# Patient Record
Sex: Female | Born: 1965
Health system: Southern US, Community
[De-identification: ages and names within clinical notes are randomized; demographics above are authoritative.]

## PROBLEM LIST (undated history)

## (undated) DIAGNOSIS — K219 Gastro-esophageal reflux disease without esophagitis: Secondary | ICD-10-CM

## (undated) DIAGNOSIS — I10 Essential (primary) hypertension: Secondary | ICD-10-CM

## (undated) DIAGNOSIS — K5900 Constipation, unspecified: Secondary | ICD-10-CM

## (undated) DIAGNOSIS — M109 Gout, unspecified: Secondary | ICD-10-CM

## (undated) DIAGNOSIS — M1990 Unspecified osteoarthritis, unspecified site: Secondary | ICD-10-CM

## (undated) DIAGNOSIS — T7840XA Allergy, unspecified, initial encounter: Secondary | ICD-10-CM

## (undated) HISTORY — DX: Essential (primary) hypertension: I10

## (undated) HISTORY — DX: Gastro-esophageal reflux disease without esophagitis: K21.9

## (undated) HISTORY — DX: Allergy, unspecified, initial encounter: T78.40XA

## (undated) HISTORY — DX: Unspecified osteoarthritis, unspecified site: M19.90

## (undated) HISTORY — DX: Gout, unspecified: M10.9

## (undated) HISTORY — DX: Constipation, unspecified: K59.00

## (undated) HISTORY — PX: NO PAST SURGERIES: SHX2092

---

## 1999-08-01 ENCOUNTER — Other Ambulatory Visit: Admission: RE | Admit: 1999-08-01 | Discharge: 1999-08-01 | Payer: Self-pay | Admitting: *Deleted

## 1999-09-28 ENCOUNTER — Encounter: Admission: RE | Admit: 1999-09-28 | Discharge: 1999-09-28 | Payer: Self-pay | Admitting: Gastroenterology

## 1999-09-28 ENCOUNTER — Encounter: Payer: Self-pay | Admitting: Gastroenterology

## 1999-11-05 ENCOUNTER — Other Ambulatory Visit: Admission: RE | Admit: 1999-11-05 | Discharge: 1999-11-05 | Payer: Self-pay | Admitting: Gastroenterology

## 2000-08-29 ENCOUNTER — Other Ambulatory Visit: Admission: RE | Admit: 2000-08-29 | Discharge: 2000-08-29 | Payer: Self-pay | Admitting: *Deleted

## 2000-09-08 ENCOUNTER — Other Ambulatory Visit: Admission: RE | Admit: 2000-09-08 | Discharge: 2000-09-08 | Payer: Self-pay | Admitting: Urology

## 2000-09-16 ENCOUNTER — Encounter: Payer: Self-pay | Admitting: Urology

## 2000-09-16 ENCOUNTER — Encounter: Admission: RE | Admit: 2000-09-16 | Discharge: 2000-09-16 | Payer: Self-pay | Admitting: Urology

## 2001-04-08 ENCOUNTER — Other Ambulatory Visit: Admission: RE | Admit: 2001-04-08 | Discharge: 2001-04-08 | Payer: Self-pay | Admitting: Urology

## 2001-08-04 ENCOUNTER — Other Ambulatory Visit: Admission: RE | Admit: 2001-08-04 | Discharge: 2001-08-04 | Payer: Self-pay | Admitting: Internal Medicine

## 2003-02-04 ENCOUNTER — Emergency Department (HOSPITAL_COMMUNITY): Admission: EM | Admit: 2003-02-04 | Discharge: 2003-02-04 | Payer: Self-pay | Admitting: Emergency Medicine

## 2004-06-05 ENCOUNTER — Other Ambulatory Visit: Admission: RE | Admit: 2004-06-05 | Discharge: 2004-06-05 | Payer: Self-pay | Admitting: Internal Medicine

## 2004-10-08 ENCOUNTER — Emergency Department (HOSPITAL_COMMUNITY): Admission: EM | Admit: 2004-10-08 | Discharge: 2004-10-08 | Payer: Self-pay | Admitting: Emergency Medicine

## 2006-07-21 ENCOUNTER — Encounter: Payer: Self-pay | Admitting: Cardiology

## 2008-03-08 ENCOUNTER — Emergency Department (HOSPITAL_COMMUNITY): Admission: EM | Admit: 2008-03-08 | Discharge: 2008-03-08 | Payer: Self-pay | Admitting: Family Medicine

## 2009-10-11 ENCOUNTER — Encounter: Payer: Self-pay | Admitting: Cardiology

## 2009-10-12 ENCOUNTER — Encounter: Payer: Self-pay | Admitting: Cardiology

## 2009-10-19 ENCOUNTER — Ambulatory Visit: Payer: Self-pay | Admitting: Cardiology

## 2009-10-19 DIAGNOSIS — I10 Essential (primary) hypertension: Secondary | ICD-10-CM | POA: Insufficient documentation

## 2009-11-02 ENCOUNTER — Ambulatory Visit (HOSPITAL_COMMUNITY): Admission: RE | Admit: 2009-11-02 | Discharge: 2009-11-02 | Payer: Self-pay | Admitting: Cardiology

## 2009-11-02 ENCOUNTER — Ambulatory Visit: Payer: Self-pay | Admitting: Cardiology

## 2009-11-02 ENCOUNTER — Ambulatory Visit: Payer: Self-pay

## 2009-11-02 ENCOUNTER — Encounter: Payer: Self-pay | Admitting: Cardiology

## 2009-11-07 ENCOUNTER — Telehealth: Payer: Self-pay | Admitting: Cardiology

## 2010-02-26 IMAGING — CR DG HIP COMPLETE 2+V*R*
3 series · 3 of 3 positions shown · non-contrast
Comparison: None

CLINICAL DATA: Right hip pain radiating down right leg, no known
injury

RIGHT HIP - COMPLETE 2+ VIEW

[view not recorded (1 of 3)]
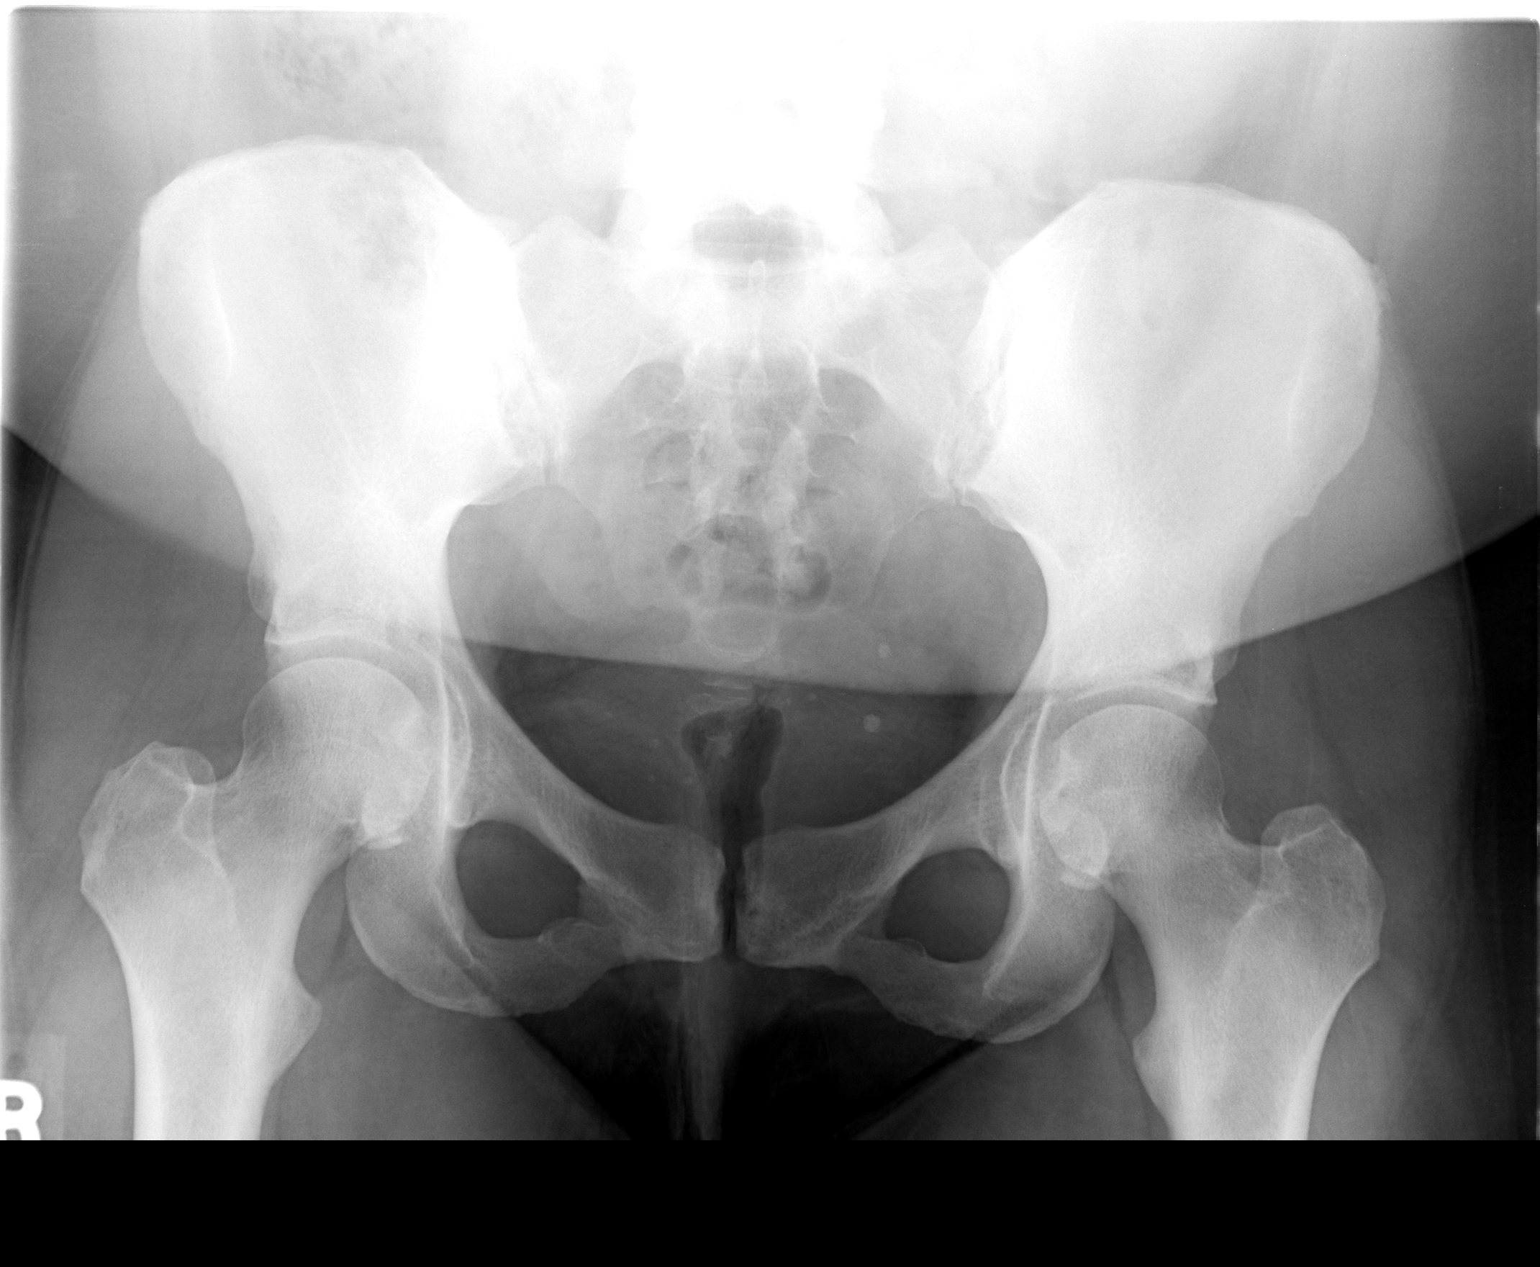

[view not recorded (2 of 3)]
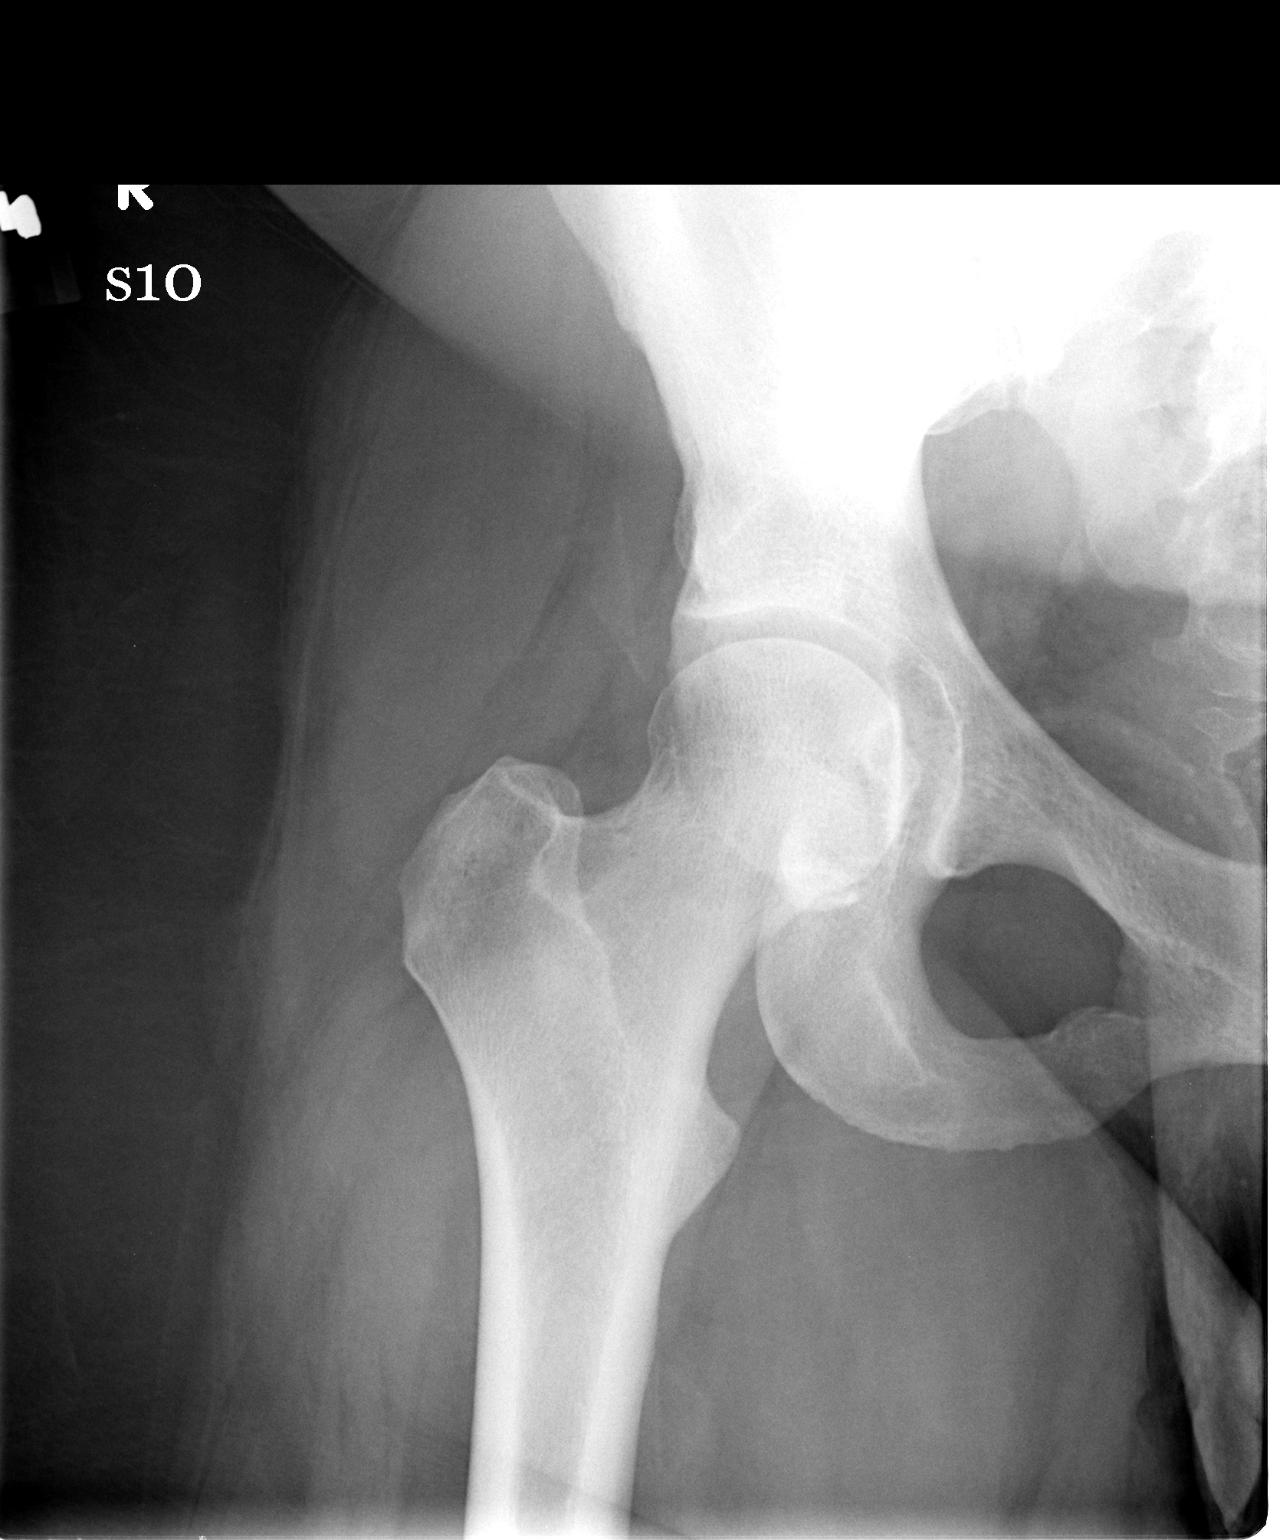

[view not recorded (3 of 3)]
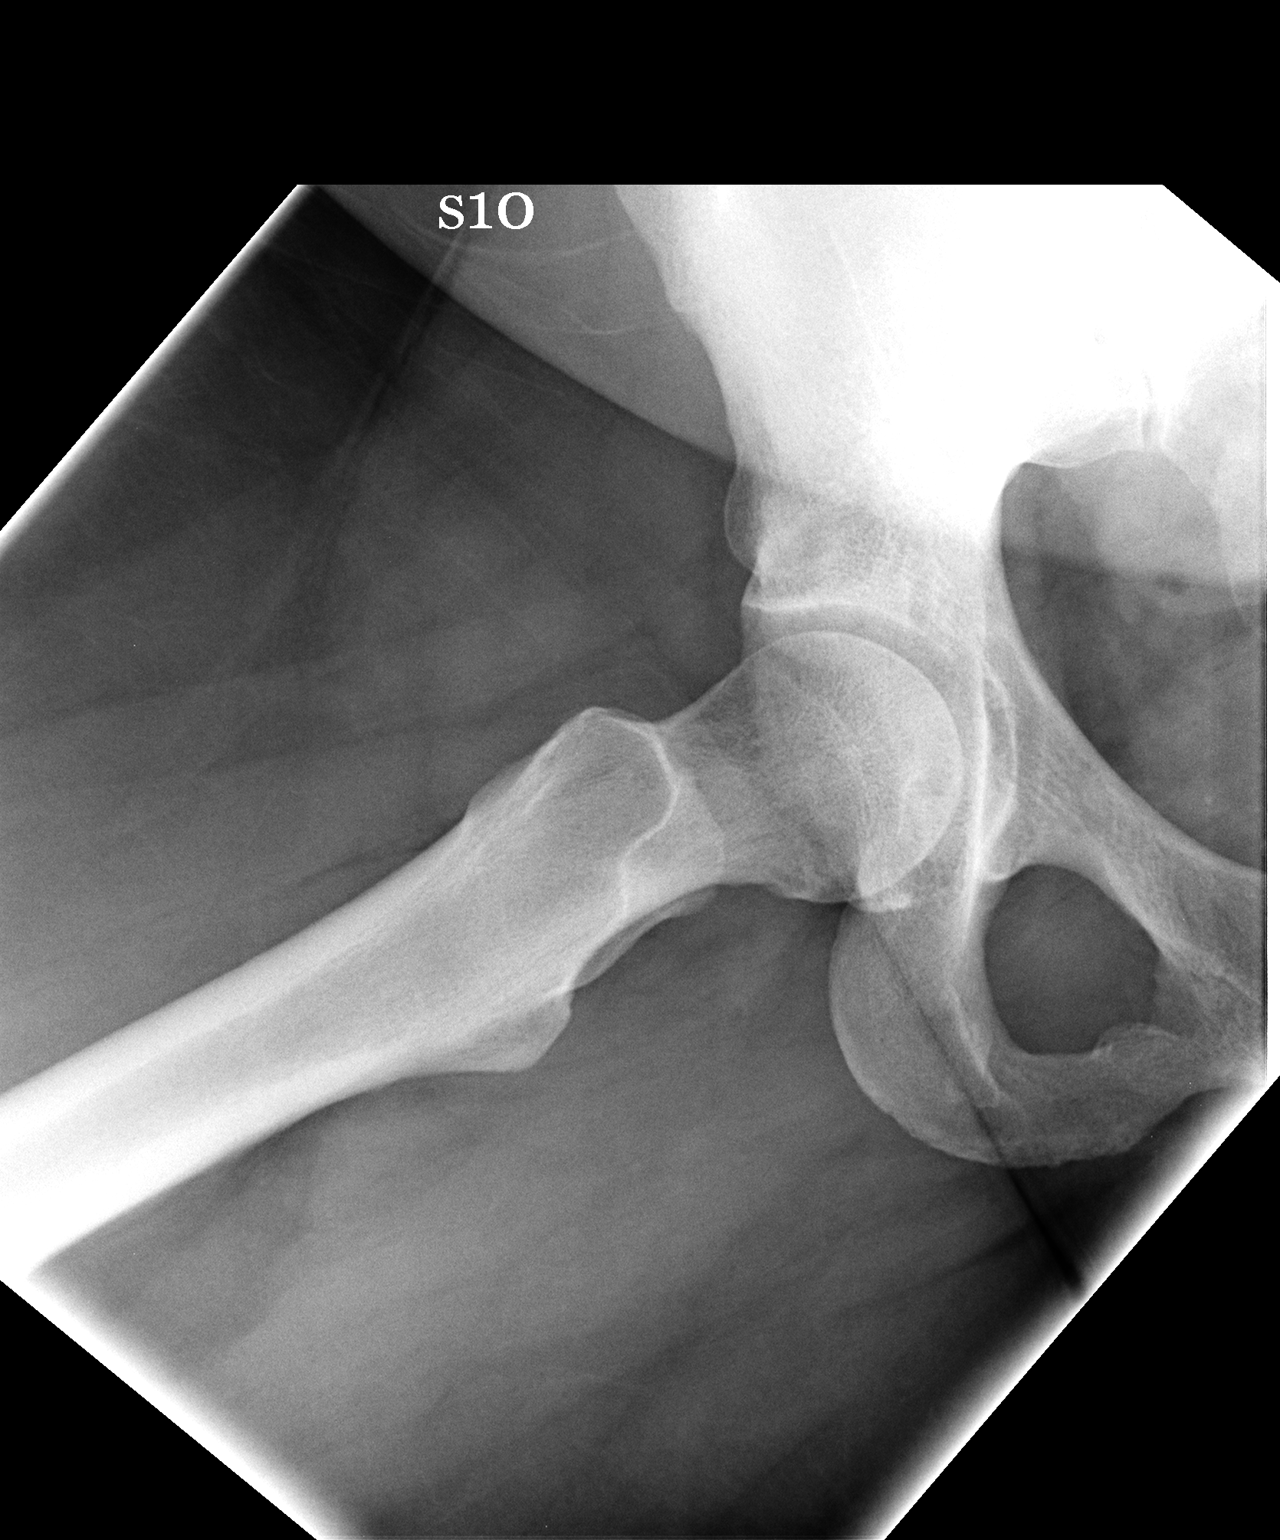

[3 of 3 positions shown; findings below may reference images not displayed]

FINDINGS: Both hip joint spaces appear normal.  No significant
degenerative change is seen.  The pelvic rami are intact.  The SI
joints appear normal.
IMPRESSION: Negative right hip.

REF:W2 DICTATED: 03/08/2008 [DATE]

## 2010-02-26 IMAGING — CR DG LUMBAR SPINE 2-3V
3 series · 3 of 3 positions shown · non-contrast
Comparison: None

CLINICAL DATA: The right hip pain radiating down right leg, no
injury

LUMBAR SPINE - 2-3 VIEW

[view not recorded (1 of 3)]
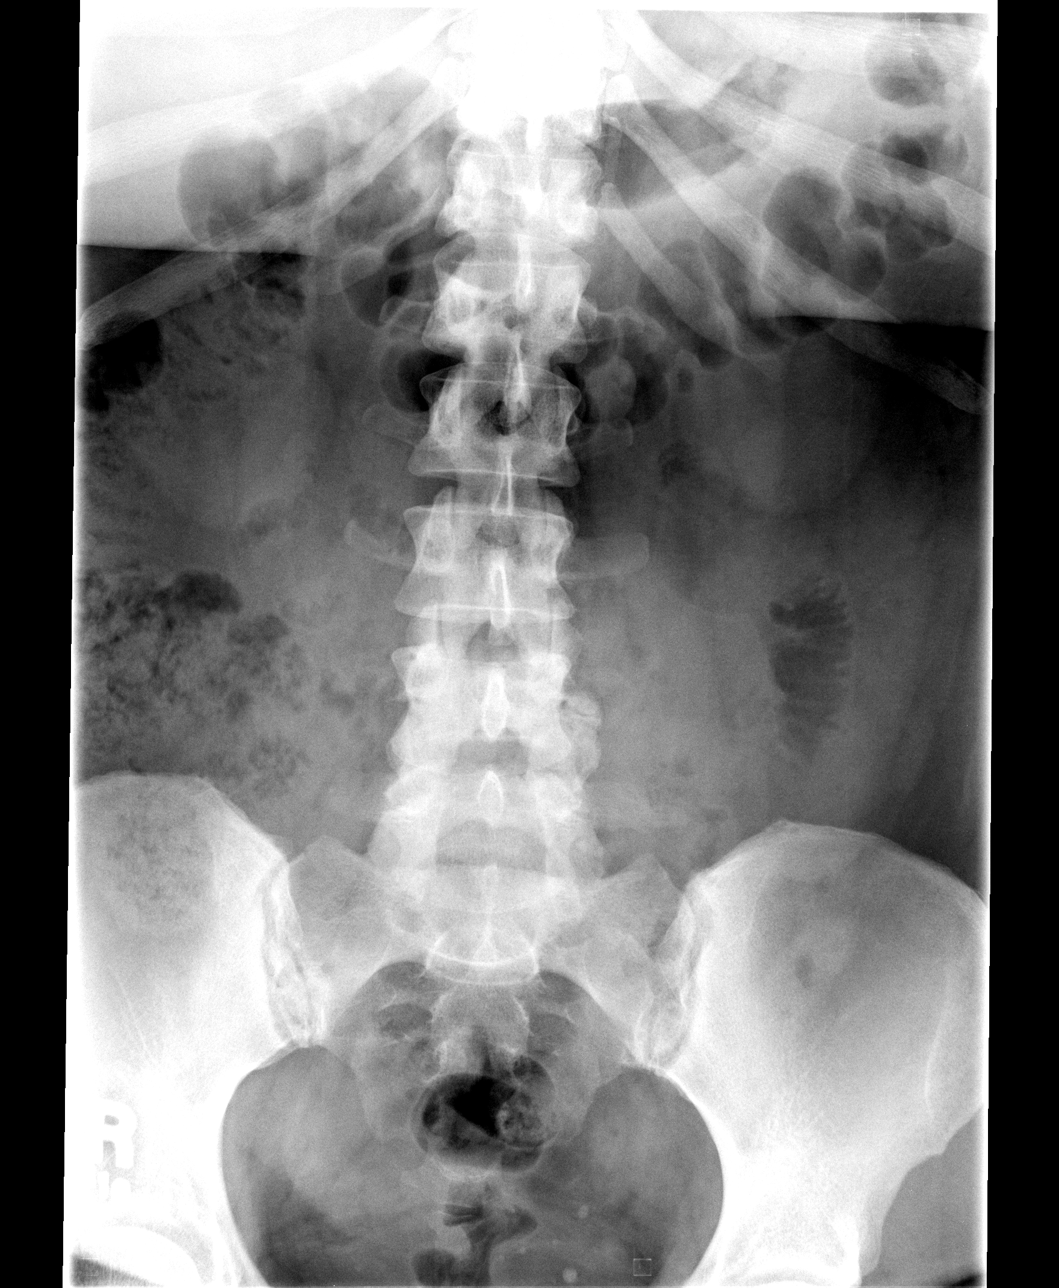

[view not recorded (2 of 3)]
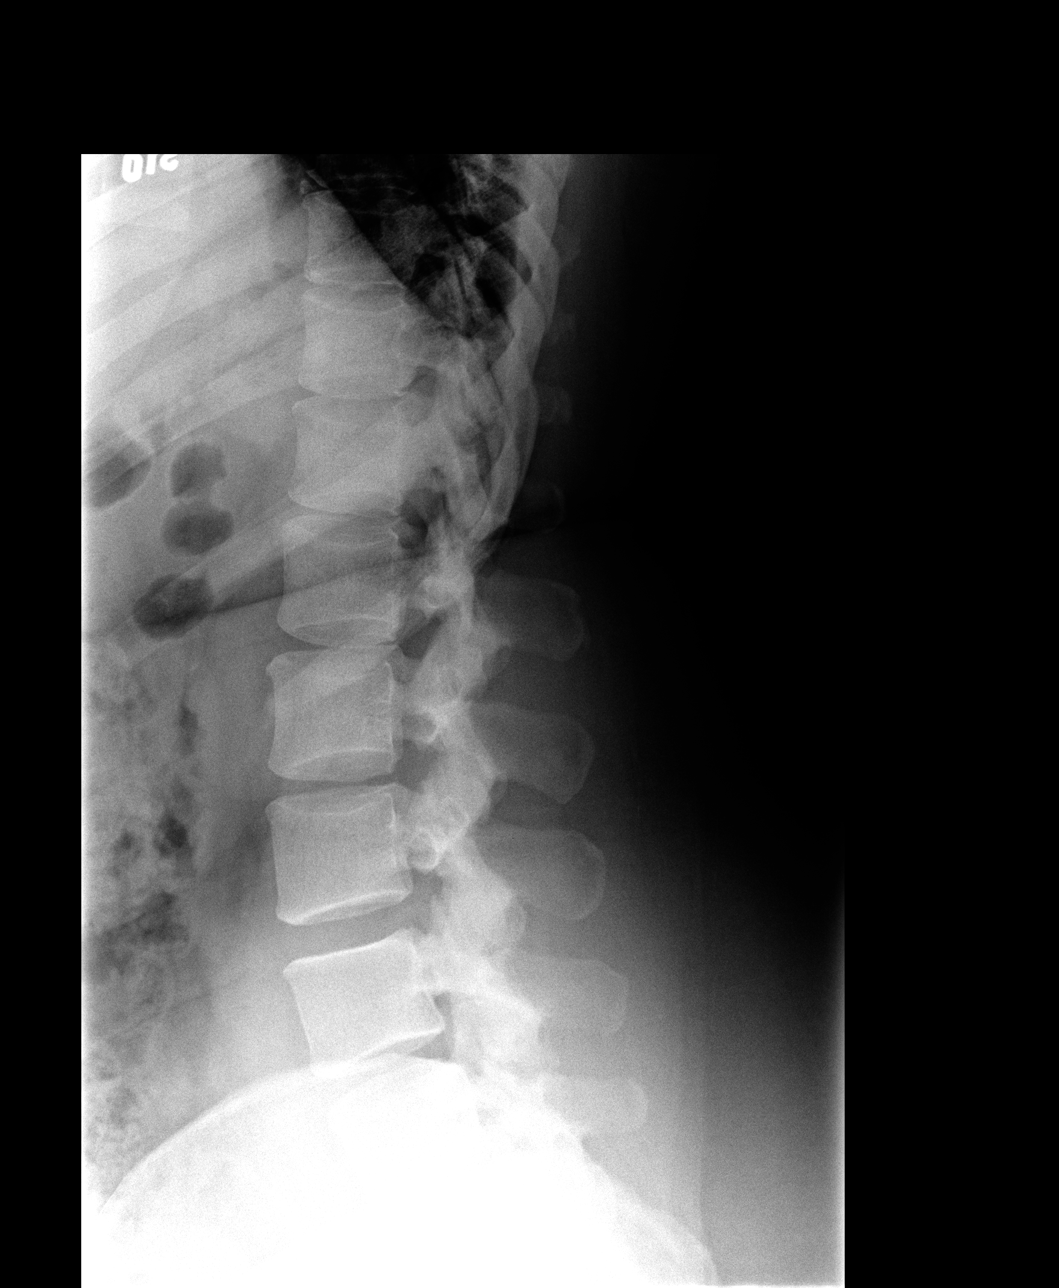

[view not recorded (3 of 3)]
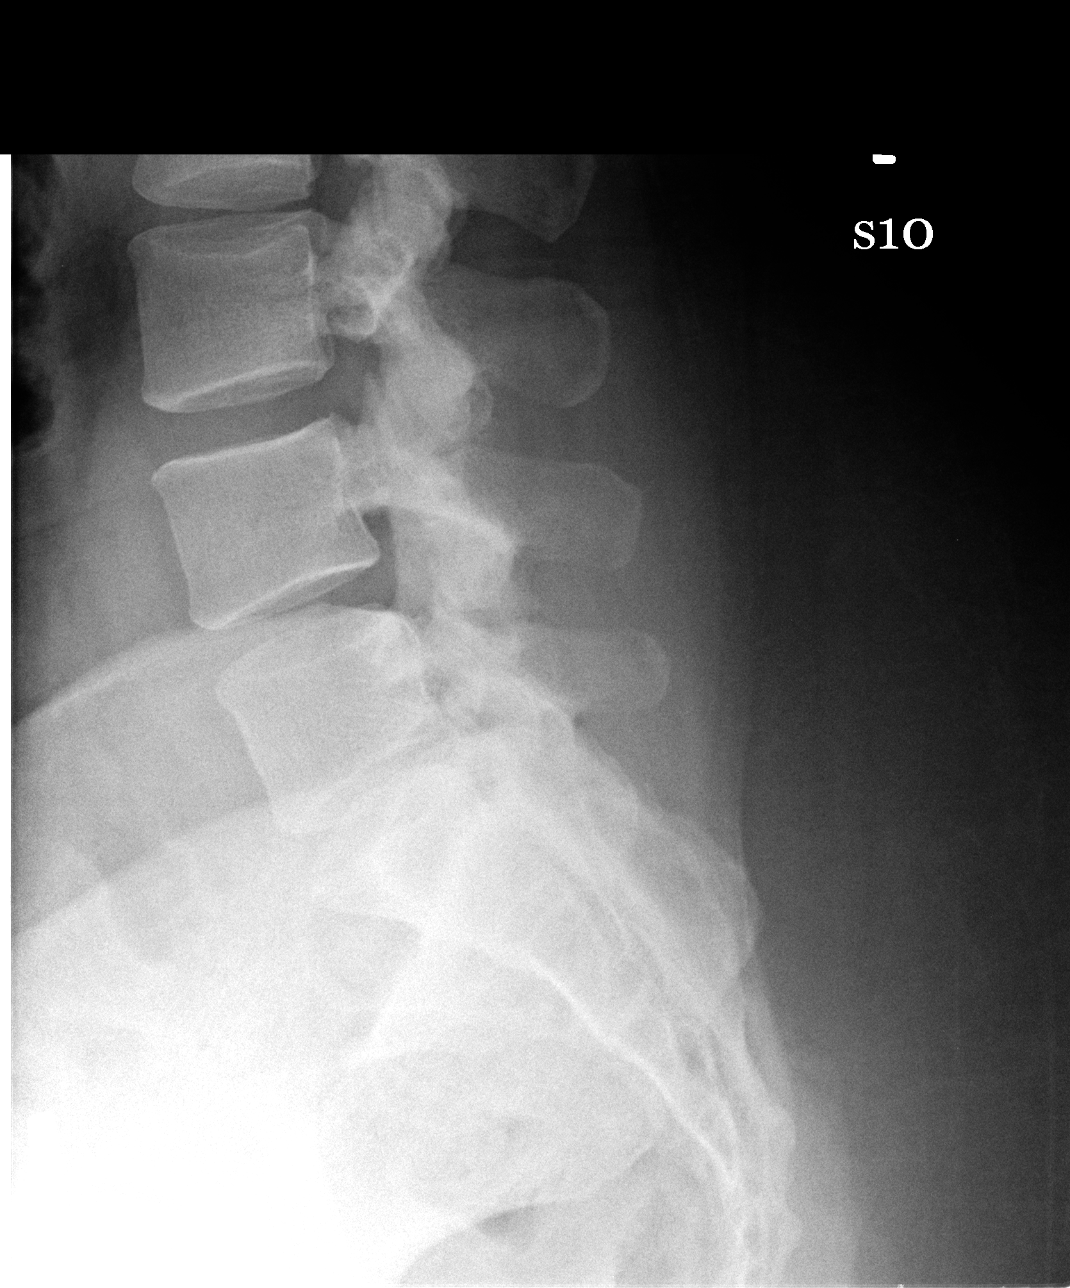

[3 of 3 positions shown; findings below may reference images not displayed]

FINDINGS: The lumbar vertebrae are in normal alignment with normal
intervertebral disc spaces.  No compression deformity is seen.
There may be degenerative change involving the facet joints
particularly of L4-5 and L5-S1.  The SI joints appear normal
IMPRESSION: .
1.  Normal alignment with normal disc spaces.
2.  Probable facet joint disease of L4-5 and L5-S1.

REF:W2 DICTATED: 03/08/2008 [DATE]

## 2010-03-08 NOTE — Progress Notes (Signed)
Summary: [facility] Adolescent Wellness Visit  [facility] Adolescent Wellness Visit   Imported By: Roderic Ovens 11/24/2009 11:47:48  _____________________________________________________________________  External Attachment:    Type:   Image     Comment:   External Document

## 2010-03-08 NOTE — Letter (Signed)
Summary: [facility] Adolescent EKG 2008 ; 2006  [facility] Adolescent EKG 2008 ; 2006   Imported By: Lynford Humphrey [place] 11/24/2009 11:44:32  _____________________________________________________________________  External Attachment:    Type:   Image     Comment:   External Document

## 2010-03-08 NOTE — Progress Notes (Signed)
Summary: test results  Phone Note Call from Patient Call back at Home Phone [phone]   Caller: Patient Summary of Call: Pt calling for test results Initial call taken by: Judie Grieve,  November 07, 2009 8:20 AM  Follow-up for Phone Call        I talked with pt about echo results

## 2010-03-08 NOTE — Assessment & Plan Note (Signed)
Summary: np6/ekg changes   Primary Provider:  Dr. Elisabeth Most  CC:  new patient with EKG changes.  Pt states that she is feeling well.  Penny Adkins  History of Present Illness: Patient with PMH significant only for HTN presents today after having been referred for poor R wave progression.  PCP concerned and referred here for Echocardiogram.  Patient just diagnosed with HTN last year.  Takes ExForge daily and HCTZ twice weekly, if more often she experiences body tingling and hypotension.  Does report some symptoms of reflux and indigestion for which she takes an OTC herbal medicine.  Denies any chest pain, dyspnea, some minor lower extremity edema relieved by elevation.     Current Problems (verified): 1)  Essential Hypertension  (ICD-401.9)  Current Medications (verified): 1)  Exforge 5-320 Mg Tabs (Amlodipine Besylate-Valsartan) .... 1/2 Tablet Once Daily 2)  Fish Oil 1000 Mg Caps (Omega-3 Fatty Acids) .... Take One Capsule Three Times A Day 3)  Vitamin D 1000 Unit Tabs (Cholecalciferol) .... Take One Tablet Once Daily 4)  Flonase 50 Mcg/act Susp (Fluticasone Propionate) .... Every Other Day 5)  Aspirin 81 Mg Tabs (Aspirin) .... Once Daily 6)  B Complex  Tabs (B Complex Vitamins) .... Take One Tablet Once Daily 7)  Hydrochlorothiazide 25 Mg Tabs (Hydrochlorothiazide) .... Take One Tablet Two Times A Week 8)  Digestive Aid .... Once Daily 9)  Geritol Tonic  Liqd (Iron-Vitamins) .... Once Every Other Day  Allergies (verified): No Known Drug Allergies  Past History:  Past Medical History: HTN - diagnosed last year Allergies - seasonal Asthma - childhood asthma.  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   Prior to that was working in [employer].  Lives at home by herself  No history of tobacco use Very occasional EtOH use. No other drug use.  Review of Systems       The patient complains of weight gain/loss.  The patient denies fatigue, malaise, fever, vision loss, decreased hearing, hoarseness, chest pain, palpitations, shortness of breath, prolonged cough, wheezing, sleep apnea, coughing up blood, abdominal pain, blood in stool, nausea, vomiting, diarrhea, heartburn, incontinence, blood in urine, muscle weakness, joint pain, leg swelling, rash, skin lesions, headache, fainting, dizziness, depression, anxiety, enlarged lymph nodes, easy bruising or bleeding, and environmental allergies.         Gained 18 lbs when started new job over past year.    Vital Signs:  Patient profile:   45 year old female Height:      66 inches Weight:      266 pounds BMI:     43.09 Pulse rate:   86 / minute Pulse rhythm:   regular BP sitting:   123 / 88  (left arm) Cuff size:   large  Vitals Entered By: Judithe Modest CMA (October 19, 2009 8:59 AM)  Physical Exam  General:  Well developed, well nourished, in no acute distress. Obese.  Head:  normocephalic and atraumatic Nose:  no deformity, discharge, inflammation, or lesions Mouth:  Teeth, gums and palate normal. Oral mucosa normal. Neck:  Neck supple, no JVD. No masses, thyromegaly or abnormal cervical nodes. Lungs:  Clear bilaterally to auscultation and percussion. Heart:  Non-displaced PMI, chest non-tender; regular  rate and rhythm, S1, S2 without murmurs, rubs. +S4. Carotid upstroke normal, no bruit. Pedals normal pulses. No edema, no varicosities. Abdomen:  Bowel sounds positive; abdomen soft and non-tender without masses, organomegaly, or hernias noted. No hepatosplenomegaly. Msk:  Back normal, normal gait. Muscle strength and tone normal. Extremities:  No clubbing or cyanosis. Neurologic:  Alert and oriented x 3. Skin:  Intact without  lesions or rashes. Psych:  Normal affect. Additional Exam:  Gen: Well-developed, well-nourished, NAD, alert and cooperative throughout exam HEENT: NCAT, PERRL, EOMI, no conjunctival inflammation, pharynx nl without erythema or exudates, MMM. External ear exam normal - TMs without bulging or erythema, good landmarks.  No nasal d/c Neck: No LAN, thyromegaly, masses, bruits CV: Regular rate and rhythm, no murmurs.  Soft S4 present.   Lungs: Normal respiratory effort. CTAB no wheezes, rales, or rhonchi noted Abd: soft, nondistended, nontender.  BS+ and normoactive.  No hepatosplenomegaly.  No masses. Ext: warm and well perfused.  No edema or cyanosis. Skin: No rashes or lesions on visible skin.  Trace LE edema BL. Neuro:  No focal deficits Musculoskeletal:  Muscle strength good BL upper and lower extremities    Impression & Recommendations:  Problem # 1:  ESSENTIAL HYPERTENSION (ICD-401.9) Most likely poor R wave progression secondary to improper lead placement at previous PCP visits as patient's EKG completely WNL today.  No real risk factors besides personal and family history of HTN.  As patient has had HTN for several years and does have soft S4 on physical exam, will check echo to assess for LVH.  Will schedule Echo at patient's convenience.  Follow-up only as needed pending Echo results.   Her updated medication list for this problem includes:    Exforge 5-320 Mg Tabs (Amlodipine besylate-valsartan) .Penny Adkins... 1/2 tablet once daily    Aspirin 81 Mg Tabs (Aspirin) ..... Once daily    Hydrochlorothiazide 25 Mg Tabs (Hydrochlorothiazide) .Penny Adkins... Take one tablet two times a week  Her updated medication list for this problem includes:    Exforge 5-320 Mg Tabs (Amlodipine besylate-valsartan) .Penny Adkins... 1/2 tablet once daily    Aspirin 81 Mg Tabs (Aspirin) ..... Once daily    Hydrochlorothiazide 25 Mg Tabs (Hydrochlorothiazide) .Penny Adkins... Take one tablet two times a week  Orders: Echocardiogram  (Echo)  Patient Instructions: 1)  Your physician has requested that you have an echocardiogram.  Echocardiography is a painless test that uses sound waves to create images of your heart. It provides your doctor with information about the size and shape of your heart and how well your heart's chambers and valves are working.  This procedure takes approximately one hour. There are no restrictions for this procedure. 2)  Your physician recommends that you schedule a follow-up appointment as needed with Dr Shirlee Latch.   Appended Document: np6/ekg changes Patient seen with resident MD Payton Mccallum, agree with note and plan.

## 2010-04-03 ENCOUNTER — Ambulatory Visit (HOSPITAL_COMMUNITY)
Admission: RE | Admit: 2010-04-03 | Discharge: 2010-04-03 | Disposition: A | Discharge status: Home / Self Care | Payer: BC Managed Care – PPO | Source: Ambulatory Visit | Attending: Internal Medicine | Admitting: Internal Medicine

## 2010-04-03 ENCOUNTER — Other Ambulatory Visit (HOSPITAL_COMMUNITY): Payer: Self-pay | Admitting: Internal Medicine

## 2010-04-03 DIAGNOSIS — IMO0002 Reserved for concepts with insufficient information to code with codable children: Secondary | ICD-10-CM

## 2010-04-03 DIAGNOSIS — M25469 Effusion, unspecified knee: Secondary | ICD-10-CM | POA: Insufficient documentation

## 2011-10-30 ENCOUNTER — Other Ambulatory Visit: Payer: Self-pay | Admitting: Obstetrics and Gynecology

## 2011-10-30 DIAGNOSIS — R928 Other abnormal and inconclusive findings on diagnostic imaging of breast: Secondary | ICD-10-CM

## 2011-10-31 ENCOUNTER — Ambulatory Visit
Admission: RE | Admit: 2011-10-31 | Discharge: 2011-10-31 | Disposition: A | Discharge status: Home / Self Care | Payer: BC Managed Care – PPO | Source: Ambulatory Visit | Attending: Obstetrics and Gynecology | Admitting: Obstetrics and Gynecology

## 2011-10-31 DIAGNOSIS — R928 Other abnormal and inconclusive findings on diagnostic imaging of breast: Secondary | ICD-10-CM

## 2013-02-15 ENCOUNTER — Other Ambulatory Visit: Payer: Self-pay

## 2013-02-15 ENCOUNTER — Other Ambulatory Visit: Payer: Self-pay | Admitting: Internal Medicine

## 2013-02-15 DIAGNOSIS — Z1231 Encounter for screening mammogram for malignant neoplasm of breast: Secondary | ICD-10-CM

## 2013-03-04 ENCOUNTER — Ambulatory Visit
Admission: RE | Admit: 2013-03-04 | Discharge: 2013-03-04 | Disposition: A | Discharge status: Home / Self Care | Payer: Managed Care, Other (non HMO) | Source: Ambulatory Visit

## 2013-03-04 DIAGNOSIS — Z1231 Encounter for screening mammogram for malignant neoplasm of breast: Secondary | ICD-10-CM

## 2014-08-17 ENCOUNTER — Other Ambulatory Visit: Payer: Self-pay | Admitting: Gastroenterology

## 2014-08-17 DIAGNOSIS — R140 Abdominal distension (gaseous): Secondary | ICD-10-CM

## 2014-08-22 ENCOUNTER — Other Ambulatory Visit: Payer: Managed Care, Other (non HMO)

## 2014-08-25 ENCOUNTER — Ambulatory Visit
Admission: RE | Admit: 2014-08-25 | Discharge: 2014-08-25 | Disposition: A | Discharge status: Home / Self Care | Payer: Managed Care, Other (non HMO) | Source: Ambulatory Visit | Attending: Gastroenterology | Admitting: Gastroenterology

## 2014-08-25 DIAGNOSIS — R140 Abdominal distension (gaseous): Secondary | ICD-10-CM

## 2017-04-02 ENCOUNTER — Encounter: Payer: Self-pay | Admitting: Obstetrics and Gynecology

## 2017-04-17 ENCOUNTER — Encounter: Payer: Self-pay | Admitting: Gastroenterology

## 2017-06-02 ENCOUNTER — Other Ambulatory Visit: Payer: Self-pay

## 2017-06-02 ENCOUNTER — Ambulatory Visit (AMBULATORY_SURGERY_CENTER): Payer: Self-pay | Admitting: *Deleted

## 2017-06-02 VITALS — Ht 66.0 in | Wt 265.0 lb

## 2017-06-02 DIAGNOSIS — Z1211 Encounter for screening for malignant neoplasm of colon: Secondary | ICD-10-CM

## 2017-06-02 MED ORDER — NA SULFATE-K SULFATE-MG SULF 17.5-3.13-1.6 GM/177ML PO SOLN: 1.0000 | Freq: Once | ORAL | 0 refills | Status: AC

## 2017-06-02 NOTE — Progress Notes (Signed)
egg and  soy allergy known to patient - she states she gets severe nausea- she can eat cakes and breads with no issues but cannot eat an actual egg without nausea  No  past sedation with any surgeries  or procedures, no past  intubation  No diet pills per patient No home 02 use per patient  No blood thinners per patient  Pt denies issues with constipation  No A fib or A flutter  EMMI video sent to pt's e mail -  Pt is unsure about sedation as she has never been sedated before- she did say light sedation may  be okay-  She has severe nausea with eggs and soy and poultry but eats cakes and breads with no issues .  Informed pt she can discuss sedation with CRNA the morning of the colon - pt instructed she has to have a care partner in case she gets sedation - she states her mom is coming - instructed she also has to have an IV- she's ok with that as well

## 2017-06-06 ENCOUNTER — Encounter: Payer: Self-pay | Admitting: Gastroenterology

## 2017-06-16 ENCOUNTER — Ambulatory Visit (AMBULATORY_SURGERY_CENTER): Payer: Managed Care, Other (non HMO) | Admitting: Gastroenterology

## 2017-06-16 ENCOUNTER — Other Ambulatory Visit: Payer: Self-pay

## 2017-06-16 ENCOUNTER — Encounter: Payer: Self-pay | Admitting: Gastroenterology

## 2017-06-16 VITALS — BP 144/75 | HR 62 | Temp 98.0°F | Resp 15 | Ht 66.0 in | Wt 265.0 lb

## 2017-06-16 DIAGNOSIS — D125 Benign neoplasm of sigmoid colon: Secondary | ICD-10-CM

## 2017-06-16 DIAGNOSIS — Z1211 Encounter for screening for malignant neoplasm of colon: Secondary | ICD-10-CM

## 2017-06-16 MED ORDER — SODIUM CHLORIDE 0.9 % IV SOLN: 500.0000 mL | Freq: Once | INTRAVENOUS | Status: DC

## 2017-06-16 NOTE — Progress Notes (Signed)
To recovery, report to RN, VSS. 

## 2017-06-16 NOTE — Op Note (Signed)
[place] Patient Name: Penny Adkins Procedure Date: 06/16/2017 9:57 AM MRN: [id number] Endoscopist: Remo Lipps P. Armbruster MD, MD Age: 52 Referring MD:  Date of Birth: 1965-03-20 Gender: Female Account #: [id number] Procedure:                Colonoscopy Indications:              Screening for colorectal malignant neoplasm, This                            is the patient's first colonoscopy Medicines:                Monitored Anesthesia Care Procedure:                Pre-Anesthesia Assessment:                           - Prior to the procedure, a History and Physical                            was performed, and patient medications and                            allergies were reviewed. The patient's tolerance of                            previous anesthesia was also reviewed. The risks                            and benefits of the procedure and the sedation                            options and risks were discussed with the patient.                            All questions were answered, and informed consent                            was obtained. Prior Anticoagulants: The patient has                            taken no previous anticoagulant or antiplatelet                            agents. ASA Grade Assessment: II - A patient with                            mild systemic disease. After reviewing the risks                            and benefits, the patient was deemed in                            satisfactory condition to undergo the procedure.  After obtaining informed consent, the colonoscope                            was passed under direct vision. Throughout the                            procedure, the patient's blood pressure, pulse, and                            oxygen saturations were monitored continuously. The                            Colonoscope was introduced through the anus and                            advanced to the  the cecum, identified by                            appendiceal orifice and ileocecal valve. The                            colonoscopy was performed without difficulty. The                            patient tolerated the procedure well. The quality                            of the bowel preparation was good. The ileocecal                            valve, appendiceal orifice, and rectum were                            photographed. Scope In: 10:04:48 AM Scope Out: 10:20:26 AM Scope Withdrawal Time: 0 hours 12 minutes 53 seconds  Total Procedure Duration: 0 hours 15 minutes 38 seconds  Findings:                 The perianal and digital rectal examinations were                            normal.                           Two sessile polyps were found in the sigmoid colon.                            The polyps were 3 to 5 mm in size. These polyps                            were removed with a cold snare. Resection and                            retrieval were complete.  A few medium-mouthed diverticula were found in the                            sigmoid colon.                           The exam was otherwise without abnormality on                            direct and retroflexion views. Complications:            No immediate complications. Estimated blood loss:                            Minimal. Estimated Blood Loss:     Estimated blood loss was minimal. Impression:               - Two 3 to 5 mm polyps in the sigmoid colon,                            removed with a cold snare. Resected and retrieved.                           - Diverticulosis in the sigmoid colon.                           - The examination was otherwise normal on direct                            and retroflexion views. Recommendation:           - Patient has a contact number available for                            emergencies. The signs and symptoms of potential                             delayed complications were discussed with the                            patient. Return to normal activities tomorrow.                            Written discharge instructions were provided to the                            patient.                           - Resume previous diet.                           - Continue present medications.                           - Await pathology results.                           -  Repeat colonoscopy for surveillance based on                            pathology results. Remo Lipps P. Armbruster MD, MD 06/16/2017 10:23:27 AM This report has been signed electronically.

## 2017-06-16 NOTE — Patient Instructions (Signed)
YOU HAD AN ENDOSCOPIC PROCEDURE TODAY AT THE [facility]:   Refer to the procedure report that was given to you for any specific questions about what was found during the examination.  If the procedure report does not answer your questions, please call your gastroenterologist to clarify.  If you requested that your care partner not be given the details of your procedure findings, then the procedure report has been included in a sealed envelope for you to review at your convenience later.  YOU SHOULD EXPECT: Some feelings of bloating in the abdomen. Passage of more gas than usual.  Walking can help get rid of the air that was put into your GI tract during the procedure and reduce the bloating. If you had a lower endoscopy (such as a colonoscopy or flexible sigmoidoscopy) you may notice spotting of blood in your stool or on the toilet paper. If you underwent a bowel prep for your procedure, you may not have a normal bowel movement for a few days.  Please Note:  You might notice some irritation and congestion in your nose or some drainage.  This is from the oxygen used during your procedure.  There is no need for concern and it should clear up in a day or so.  SYMPTOMS TO REPORT IMMEDIATELY:   Following lower endoscopy (colonoscopy or flexible sigmoidoscopy):  Excessive amounts of blood in the stool  Significant tenderness or worsening of abdominal pains  Swelling of the abdomen that is new, acute  Fever of 100F or higher   For urgent or emergent issues, a gastroenterologist can be reached at any hour by calling [phone].   DIET:  We do recommend a small meal at first, but then you may proceed to your regular diet.  Drink plenty of fluids but you should avoid alcoholic beverages for 24 hours.  ACTIVITY:  You should plan to take it easy for the rest of today and you should NOT DRIVE or use heavy machinery until tomorrow (because of the sedation medicines used during the test).     FOLLOW UP: Our staff will call the number listed on your records the next business day following your procedure to check on you and address any questions or concerns that you may have regarding the information given to you following your procedure. If we do not reach you, we will leave a message.  However, if you are feeling well and you are not experiencing any problems, there is no need to return our call.  We will assume that you have returned to your regular daily activities without incident.  If any biopsies were taken you will be contacted by phone or by letter within the next 1-3 weeks.  Please call us at [phone] if you have not heard about the biopsies in 3 weeks.    SIGNATURES/CONFIDENTIALITY: You and/or your care partner have signed paperwork which will be entered into your electronic medical record.  These signatures attest to the fact that that the information above on your After Visit Summary has been reviewed and is understood.  Full responsibility of the confidentiality of this discharge information lies with you and/or your care-partner.  Read all of the handouts given to you by your recovery room nurse. 

## 2017-06-16 NOTE — Progress Notes (Signed)
Called to room to assist during endoscopic procedure.  Patient ID and intended procedure confirmed with present staff. Received instructions for my participation in the procedure from the performing physician.  

## 2017-06-16 NOTE — Progress Notes (Signed)
Pt's states no medical or surgical changes since previsit or office visit. 

## 2017-06-17 ENCOUNTER — Telehealth: Payer: Self-pay

## 2017-06-17 ENCOUNTER — Telehealth: Payer: Self-pay | Admitting: *Deleted

## 2017-06-17 NOTE — Telephone Encounter (Signed)
  Follow up Call-  Call back number 06/16/2017  Post procedure Call Back phone  # [phone]  Permission to leave phone message Yes  Some recent data might be hidden     Patient questions:  Do you have a fever, pain , or abdominal swelling? No. Pain Score  0 *  Have you tolerated food without any problems? Yes.    Have you been able to return to your normal activities? Yes.    Do you have any questions about your discharge instructions: Diet   No. Medications  No. Follow up visit  No.  Do you have questions or concerns about your Care? No.  Actions: * If pain score is 4 or above: No action needed, pain <4.

## 2017-06-17 NOTE — Telephone Encounter (Signed)
-----   Message from Yetta Flock, MD sent at 06/16/2017 12:05 PM EDT ----- Regarding: clinic visit Hi Jan,  Can you help schedule this patient for a routine follow up office visit? Thanks

## 2017-06-17 NOTE — Telephone Encounter (Signed)
Called and spoke to pt. Scheduled her for on OV with Dr. Loni Muse on 08-08-17 and placed her on the wait list.

## 2017-06-17 NOTE — Telephone Encounter (Signed)
Lm for pt with details.  She can call back and cancel if she would like or keep appt to discuss digestive issues.

## 2017-06-17 NOTE — Telephone Encounter (Signed)
-----   Message from Yetta Flock, MD sent at 06/17/2017  9:28 AM EDT ----- Regarding: RE: clinic visit Jan this patient asked me after her colonoscopy yesterday if she could see me in the office to discuss "digestive issues". I only asked to book her because she asked me to. Maybe she didn't remember why as she was waking up from anesthesia. If she doesn't feel like she needs to then she doesn't need an appointment, only if she wishes to address issues that we didn't have enough time to discuss yesterday. Thanks    ----- Message ----- From: Roetta Sessions, CMA Sent: 06/17/2017   9:22 AM To: Yetta Flock, MD Subject: RE: clinic visit                               Scheduled pt for July 5th and placed her on the wait list for an earlier appt. She was a little concerned as to why you wanted her to come in.  If there is something she should know now, she asked that someone pls call her. Thanks, Jan   ----- Message ----- From: Yetta Flock, MD Sent: 06/16/2017  12:05 PM To: Roetta Sessions, CMA Subject: clinic visit                                   Hi Jan,  Can you help schedule this patient for a routine follow up office visit? Thanks

## 2017-06-17 NOTE — Telephone Encounter (Signed)
Pt called back.  She is moving and would like to come in sooner. Rescheduled her to see Tye Savoy, NP next week.

## 2017-06-24 ENCOUNTER — Encounter: Payer: Self-pay | Admitting: Gastroenterology

## 2017-06-24 ENCOUNTER — Encounter: Payer: Self-pay | Admitting: Nurse Practitioner

## 2017-06-24 ENCOUNTER — Ambulatory Visit: Payer: Managed Care, Other (non HMO) | Admitting: Nurse Practitioner

## 2017-06-24 VITALS — BP 118/64 | HR 94 | Ht 66.0 in | Wt 266.0 lb

## 2017-06-24 DIAGNOSIS — K5909 Other constipation: Secondary | ICD-10-CM | POA: Diagnosis not present

## 2017-06-24 NOTE — Patient Instructions (Signed)
   Please come back and see [place] as needed.   I appreciate the opportunity to care for you.

## 2017-06-24 NOTE — Progress Notes (Signed)
      IMPRESSION and PLAN:    #60.  52 year old female with chronic constipation.  Patient manages constipation nicely with diet, increased water intake, magnesium, stool softeners, and probiotics.  She just wanted to check for moment to see if there was anything which could just fix the constipation.  -We discussed the nature of her constipation which is likely functional.  Other than adding back exercise there isn't more much she could do. Discussed Rx meds for constipation such as Linzess / Amitiza but she would rather stay with her current regimen and avoid prescription medications.     #2.  History of adenomatous colon polyps.  Had screening colonoscopy less than 2 weeks ago with removal of 2 small serrated adenomas.  No high-grade dysplasia seen on path.  Patient has not yet received results letter in the mail, I suspect she will be for recall colonoscopy in 5 years.  She will look for the letter      HPI:    Chief Complaint: chronic constipation.    Patient is a patient is a 52 year old female who underwent screening colonoscopy with Dr. Havery Moros on the 13th of this month. Two sessile polyps were removed ranging from 3 to 5 mm in size, diverticulosis present.  Both polyps were sessile serrated without dysplasia.  She has come in today to discuss constipation.   -He gives a long-standing history of constipation which is gotten worse with menopause.  She describes constipation as hard and infrequent stools.  The does a good job managing the constipation though it does take some effort.  She takes magnesium twice daily, 3 stool softeners at bedtime, probiotics, eats a lot of vegetables, and drinks up to 80 ounces of water every day.  The only thing she has not been able to do lately is exercise.  If she follows the above regimen then she is able to have about 2 bowel movements a day, usually after meals.   Penny Adkins has occasional GERD but basically manages it with avoidance of certain  foods.  She is intolerant of raw kale and rale broccoli.  She knows which foods calls excessive gassiness with bloating.  Review of systems:  No chest pain. No SOB.      Past Medical History:  Diagnosis Date  . Allergy    ragweed  . Arthritis   . Constipation    pt uses stool softener, pre/probiotic, and digestive enzymes   . GERD (gastroesophageal reflux disease)   . Gout   . Hypertension     Patient's surgical history, family medical history, social history, medications and allergies were all reviewed in Epic    Physical Exam:     BP 118/64   Pulse 94   Ht 5\' 6"  (1.676 m)   Wt 266 lb (120.7 kg)   LMP 03/20/2010   BMI 42.93 kg/m   GENERAL:  Pleasant female in NAD PSYCH: :cooperative, normal affect PULM: Normal respiratory effort, lungs CTA bilaterally, no wheezing Musculoskeletal:  Normal muscle tone, normal strength NEURO: Alert and oriented x 3, no focal neurologic deficits   Tye Savoy , NP 06/24/2017, 2:11 PM

## 2017-06-24 NOTE — Progress Notes (Signed)
Agree with assessment and plan as outlined.  

## 2017-08-05 ENCOUNTER — Encounter

## 2017-08-08 ENCOUNTER — Ambulatory Visit: Payer: Managed Care, Other (non HMO) | Admitting: Gastroenterology

## 2018-05-11 ENCOUNTER — Other Ambulatory Visit: Payer: Self-pay

## 2018-05-11 ENCOUNTER — Ambulatory Visit (HOSPITAL_BASED_OUTPATIENT_CLINIC_OR_DEPARTMENT_OTHER)
Admission: RE | Admit: 2018-05-11 | Discharge: 2018-05-11 | Disposition: A | Discharge status: Home / Self Care | Payer: BLUE CROSS/BLUE SHIELD | Source: Ambulatory Visit | Attending: Chiropractic Medicine | Admitting: Chiropractic Medicine

## 2018-05-11 ENCOUNTER — Other Ambulatory Visit (HOSPITAL_BASED_OUTPATIENT_CLINIC_OR_DEPARTMENT_OTHER): Payer: Self-pay | Admitting: Chiropractic Medicine

## 2018-05-11 DIAGNOSIS — M25511 Pain in right shoulder: Secondary | ICD-10-CM | POA: Diagnosis present

## 2022-05-24 ENCOUNTER — Encounter: Payer: Self-pay | Admitting: Gastroenterology
